# Patient Record
Sex: Male | Born: 1995 | Race: Black or African American | Hispanic: No | Marital: Single | Smoking: Never smoker
Health system: Southern US, Community
[De-identification: ages and names within clinical notes are randomized; demographics above are authoritative.]

## PROBLEM LIST (undated history)

## (undated) DIAGNOSIS — J45909 Unspecified asthma, uncomplicated: Secondary | ICD-10-CM

## (undated) DIAGNOSIS — J189 Pneumonia, unspecified organism: Secondary | ICD-10-CM

## (undated) HISTORY — PX: WISDOM TOOTH EXTRACTION: SHX21

---

## 2017-12-09 ENCOUNTER — Encounter (HOSPITAL_COMMUNITY): Payer: Self-pay | Admitting: Emergency Medicine

## 2017-12-09 ENCOUNTER — Emergency Department (HOSPITAL_COMMUNITY)
Admission: EM | Admit: 2017-12-09 | Discharge: 2017-12-09 | Disposition: A | Discharge status: Home / Self Care | Payer: BLUE CROSS/BLUE SHIELD | Attending: Emergency Medicine | Admitting: Emergency Medicine

## 2017-12-09 ENCOUNTER — Other Ambulatory Visit: Payer: Self-pay

## 2017-12-09 ENCOUNTER — Emergency Department (HOSPITAL_COMMUNITY): Payer: BLUE CROSS/BLUE SHIELD

## 2017-12-09 DIAGNOSIS — Y92410 Unspecified street and highway as the place of occurrence of the external cause: Secondary | ICD-10-CM | POA: Insufficient documentation

## 2017-12-09 DIAGNOSIS — J45909 Unspecified asthma, uncomplicated: Secondary | ICD-10-CM | POA: Diagnosis not present

## 2017-12-09 DIAGNOSIS — S022XXA Fracture of nasal bones, initial encounter for closed fracture: Secondary | ICD-10-CM | POA: Diagnosis not present

## 2017-12-09 DIAGNOSIS — Y999 Unspecified external cause status: Secondary | ICD-10-CM | POA: Diagnosis not present

## 2017-12-09 DIAGNOSIS — S01512A Laceration without foreign body of oral cavity, initial encounter: Secondary | ICD-10-CM | POA: Diagnosis not present

## 2017-12-09 DIAGNOSIS — Y9389 Activity, other specified: Secondary | ICD-10-CM | POA: Insufficient documentation

## 2017-12-09 DIAGNOSIS — S0992XA Unspecified injury of nose, initial encounter: Secondary | ICD-10-CM | POA: Diagnosis present

## 2017-12-09 HISTORY — DX: Unspecified asthma, uncomplicated: J45.909

## 2017-12-09 MED ORDER — CYCLOBENZAPRINE HCL 10 MG PO TABS: 10.0000 mg | ORAL_TABLET | Freq: Two times a day (BID) | ORAL | 0 refills | Status: DC | PRN

## 2017-12-09 MED ORDER — IBUPROFEN 200 MG PO TABS: 600.0000 mg | ORAL_TABLET | Freq: Once | ORAL | Status: DC

## 2017-12-09 NOTE — ED Notes (Signed)
Bed: WTR5 Expected date:  Expected time:  Means of arrival:  Comments: 
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
## 2017-12-09 NOTE — Discharge Instructions (Addendum)
Please read instructions below. DO NOT BLOW YOUR NOSE. Apply ice to your nose and other areas of pain for 20 minutes at a time. Rinse your mouth with warm salt water after meals to ensure the cut inside your mouth stays clean and heals. You can take 600 mg of Advil/ibuprofen every 6 hours as needed for pain. You can take flexeril every 12 hours as needed for muscle spasm. Be aware this medication can make you drowsy. Schedule an appointment with your primary care provider to follow up on your visit today. Return to the ER for severely worsening headache, vision changes, if new numbness or tingling in your arms or legs, inability to urinate, inability to hold your bowels, or weakness in your extremities.
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
## 2017-12-17 ENCOUNTER — Encounter (HOSPITAL_COMMUNITY): Payer: Self-pay | Admitting: *Deleted

## 2017-12-17 ENCOUNTER — Other Ambulatory Visit: Payer: Self-pay

## 2017-12-17 ENCOUNTER — Encounter (HOSPITAL_COMMUNITY): Payer: Self-pay | Admitting: Anesthesiology

## 2017-12-17 NOTE — Anesthesia Preprocedure Evaluation (Deleted)
Anesthesia Evaluation  Patient identified by MRN, date of birth, ID band Patient awake    Reviewed: Allergy & Precautions, NPO status , Patient's Chart, lab work & pertinent test results  Airway Mallampati: II  TM Distance: >3 FB Neck ROM: Full    Dental no notable dental hx. (+) Dental Advisory Given, Teeth Intact   Pulmonary asthma ,    Pulmonary exam normal breath sounds clear to auscultation       Cardiovascular Exercise Tolerance: Good negative cardio ROS Normal cardiovascular exam Rhythm:Regular Rate:Normal     Neuro/Psych negative neurological ROS  negative psych ROS   GI/Hepatic negative GI ROS, Neg liver ROS,   Endo/Other  negative endocrine ROS  Renal/GU negative Renal ROS  negative genitourinary   Musculoskeletal negative musculoskeletal ROS (+)   Abdominal   Peds negative pediatric ROS (+)  Hematology negative hematology ROS (+)   Anesthesia Other Findings Pt  ate rice at 0415 surgery delayed until after 1015  Reproductive/Obstetrics negative OB ROS                            Anesthesia Physical Anesthesia Plan  ASA: I  Anesthesia Plan: General   Post-op Pain Management:    Induction: Intravenous  PONV Risk Score and Plan: Treatment may vary due to age or medical condition  Airway Management Planned: Oral ETT  Additional Equipment:   Intra-op Plan:   Post-operative Plan: Extubation in OR  Informed Consent: I have reviewed the patients History and Physical, chart, labs and discussed the procedure including the risks, benefits and alternatives for the proposed anesthesia with the patient or authorized representative who has indicated his/her understanding and acceptance.   Dental advisory given  Plan Discussed with: CRNA  Anesthesia Plan Comments:       Anesthesia Quick Evaluation
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
## 2017-12-17 NOTE — Progress Notes (Signed)
Spoke with pt for pre-op call. Pt denies cardiac history or diabetes.  

## 2017-12-18 ENCOUNTER — Ambulatory Visit (HOSPITAL_COMMUNITY)
Admission: RE | Admit: 2017-12-18 | Discharge: 2017-12-18 | Disposition: A | Discharge status: Home / Self Care | Payer: Managed Care, Other (non HMO) | Source: Ambulatory Visit | Attending: Otolaryngology | Admitting: Otolaryngology

## 2017-12-18 ENCOUNTER — Encounter (HOSPITAL_COMMUNITY)
Admission: RE | Disposition: A | Discharge status: Home / Self Care | Payer: Self-pay | Source: Ambulatory Visit | Attending: Otolaryngology

## 2017-12-18 DIAGNOSIS — S022XXA Fracture of nasal bones, initial encounter for closed fracture: Secondary | ICD-10-CM | POA: Diagnosis present

## 2017-12-18 DIAGNOSIS — Z5309 Procedure and treatment not carried out because of other contraindication: Secondary | ICD-10-CM | POA: Diagnosis not present

## 2017-12-18 DIAGNOSIS — Z791 Long term (current) use of non-steroidal anti-inflammatories (NSAID): Secondary | ICD-10-CM | POA: Diagnosis not present

## 2017-12-18 DIAGNOSIS — Z87891 Personal history of nicotine dependence: Secondary | ICD-10-CM | POA: Insufficient documentation

## 2017-12-18 DIAGNOSIS — J45909 Unspecified asthma, uncomplicated: Secondary | ICD-10-CM | POA: Insufficient documentation

## 2017-12-18 DIAGNOSIS — Z79899 Other long term (current) drug therapy: Secondary | ICD-10-CM | POA: Diagnosis not present

## 2017-12-18 HISTORY — DX: Pneumonia, unspecified organism: J18.9

## 2017-12-18 LAB — COMPREHENSIVE METABOLIC PANEL
ALK PHOS: 53 U/L (ref 38–126)
ALT: 22 U/L (ref 17–63)
AST: 32 U/L (ref 15–41)
Albumin: 4.1 g/dL (ref 3.5–5.0)
Anion gap: 6 (ref 5–15)
BILIRUBIN TOTAL: 1.3 mg/dL — AB (ref 0.3–1.2)
BUN: 14 mg/dL (ref 6–20)
CALCIUM: 9.2 mg/dL (ref 8.9–10.3)
CO2: 27 mmol/L (ref 22–32)
CREATININE: 1.04 mg/dL (ref 0.61–1.24)
Chloride: 104 mmol/L (ref 101–111)
GFR calc Af Amer: >60 mL/min (ref >60)
Glucose, Bld: 99 mg/dL (ref 65–99)
Potassium: 4 mmol/L (ref 3.5–5.1)
Sodium: 137 mmol/L (ref 135–145)
TOTAL PROTEIN: 7.3 g/dL (ref 6.5–8.1)

## 2017-12-18 LAB — CBC
HCT: 41.4 % (ref 39.0–52.0)
HEMOGLOBIN: 13.9 g/dL (ref 13.0–17.0)
MCH: 27.7 pg (ref 26.0–34.0)
MCHC: 33.6 g/dL (ref 30.0–36.0)
MCV: 82.5 fL (ref 78.0–100.0)
Platelets: 272 10*3/uL (ref 150–400)
RBC: 5.02 MIL/uL (ref 4.22–5.81)
RDW: 13 % (ref 11.5–15.5)
WBC: 6.8 10*3/uL (ref 4.0–10.5)

## 2017-12-18 SURGERY — CLOSED REDUCTION, FRACTURE, NASAL BONE
Anesthesia: General

## 2017-12-18 SURGICAL SUPPLY — 30 items
BENZOIN TINCTURE PRP APPL 2/3 (GAUZE/BANDAGES/DRESSINGS) ×3 IMPLANT
BLADE SURG 15 STRL LF DISP TIS (BLADE) IMPLANT
BLADE SURG 15 STRL SS (BLADE)
CANISTER SUCT 3000ML PPV (MISCELLANEOUS) ×3 IMPLANT
CLOSURE WOUND 1/2 X4 (GAUZE/BANDAGES/DRESSINGS) ×1
COVER BACK TABLE 60X90IN (DRAPES) ×3 IMPLANT
COVER MAYO STAND STRL (DRAPES) ×3 IMPLANT
DRAPE HALF SHEET 40X57 (DRAPES) IMPLANT
DRESSING MEROCEL 8CM (GAUZE/BANDAGES/DRESSINGS) IMPLANT
DRESSING NASAL POPE 10X1.5X2.5 (GAUZE/BANDAGES/DRESSINGS) IMPLANT
DRSG MEROCEL 8CM (GAUZE/BANDAGES/DRESSINGS)
DRSG NASAL POPE 10X1.5X2.5 (GAUZE/BANDAGES/DRESSINGS)
DRSG TELFA 3X8 NADH (GAUZE/BANDAGES/DRESSINGS) ×3 IMPLANT
GAUZE SPONGE 4X4 16PLY XRAY LF (GAUZE/BANDAGES/DRESSINGS) ×3 IMPLANT
GLOVE ECLIPSE 7.5 STRL STRAW (GLOVE) ×3 IMPLANT
GOWN STRL REUS W/ TWL LRG LVL3 (GOWN DISPOSABLE) ×1 IMPLANT
GOWN STRL REUS W/TWL LRG LVL3 (GOWN DISPOSABLE) ×2
KIT BASIN OR (CUSTOM PROCEDURE TRAY) IMPLANT
KIT TURNOVER KIT B (KITS) ×3 IMPLANT
NEEDLE PRECISIONGLIDE 27X1.5 (NEEDLE) ×3 IMPLANT
NS IRRIG 1000ML POUR BTL (IV SOLUTION) ×3 IMPLANT
PAD ARMBOARD 7.5X6 YLW CONV (MISCELLANEOUS) ×6 IMPLANT
PATTIES SURGICAL .5 X3 (DISPOSABLE) ×3 IMPLANT
SPLINT NASAL THERMO PLAST (MISCELLANEOUS) ×3 IMPLANT
STRIP CLOSURE SKIN 1/2X4 (GAUZE/BANDAGES/DRESSINGS) ×2 IMPLANT
SYR CONTROL 10ML LL (SYRINGE) ×3 IMPLANT
TOWEL OR 17X24 6PK STRL BLUE (TOWEL DISPOSABLE) ×3 IMPLANT
TUBE CONNECTING 12'X1/4 (SUCTIONS) ×1
TUBE CONNECTING 12X1/4 (SUCTIONS) ×2 IMPLANT
WATER STERILE IRR 1000ML POUR (IV SOLUTION) ×3 IMPLANT

## 2017-12-18 NOTE — Progress Notes (Signed)
Dr. Richardson Landry is aware that the patient ate Rice at [phone]
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
